# Patient Record
Sex: Male | Born: 2000 | Race: Asian | Hispanic: No | Marital: Single | Smoking: Never smoker
Health system: Southern US, Community
[De-identification: ages and names within clinical notes are randomized; demographics above are authoritative.]

## PROBLEM LIST (undated history)

## (undated) DIAGNOSIS — S83419A Sprain of medial collateral ligament of unspecified knee, initial encounter: Secondary | ICD-10-CM

## (undated) DIAGNOSIS — S83512A Sprain of anterior cruciate ligament of left knee, initial encounter: Secondary | ICD-10-CM

## (undated) DIAGNOSIS — S83412D Sprain of medial collateral ligament of left knee, subsequent encounter: Secondary | ICD-10-CM

## (undated) DIAGNOSIS — S83289A Other tear of lateral meniscus, current injury, unspecified knee, initial encounter: Secondary | ICD-10-CM

## (undated) DIAGNOSIS — S83512D Sprain of anterior cruciate ligament of left knee, subsequent encounter: Secondary | ICD-10-CM

## (undated) DIAGNOSIS — S838X2D Sprain of other specified parts of left knee, subsequent encounter: Secondary | ICD-10-CM

---
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
## 2019-11-27 ENCOUNTER — Ambulatory Visit: Payer: PRIVATE HEALTH INSURANCE | Attending: Internal Medicine

## 2019-11-27 DIAGNOSIS — Z23 Encounter for immunization: Secondary | ICD-10-CM

## 2019-11-27 NOTE — Progress Notes (Signed)
   Covid-19 Vaccination Clinic  Name:  Austin Walls    MRN: [id number] DOB: 10-Feb-2001  11/27/2019  [name] was observed post Covid-19 immunization for 15 minutes without incident. He was provided with Vaccine Information Sheet and instruction to access the V-Safe system.   [name] was instructed to call 911 with any severe reactions post vaccine: Marland Kitchen Difficulty breathing  . Swelling of face and throat  . A fast heartbeat  . A bad rash all over body  . Dizziness and weakness   Immunizations Administered    Name Date Dose VIS Date Route   Pfizer COVID-19 Vaccine 11/27/2019  9:06 AM 0.3 mL 07/30/2019 Intramuscular   Manufacturer: [employer], [employer]   Lot: [id number]   NDC: [address]-6

## 2019-12-20 ENCOUNTER — Ambulatory Visit: Payer: PRIVATE HEALTH INSURANCE | Attending: Internal Medicine

## 2019-12-20 DIAGNOSIS — Z23 Encounter for immunization: Secondary | ICD-10-CM

## 2019-12-20 NOTE — Progress Notes (Signed)
   Covid-19 Vaccination Clinic  Name:  JEREMAINE MARAJ    MRN: [id number] DOB: 2001/05/02  12/20/2019  Mr. Calzadilla was observed post Covid-19 immunization for 15 minutes without incident. He was provided with Vaccine Information Sheet and instruction to access the V-Safe system.   Mr. Esau was instructed to call 911 with any severe reactions post vaccine: Marland Kitchen Difficulty breathing  . Swelling of face and throat  . A fast heartbeat  . A bad rash all over body  . Dizziness and weakness   Immunizations Administered    Name Date Dose VIS Date Route   Pfizer COVID-19 Vaccine 12/20/2019  9:30 AM 0.3 mL 10/13/2018 Intramuscular   Manufacturer: [employer], [employer]   Lot: [id number]   NDC: [address]-0
